# Patient Record
Sex: Male | Born: 1960 | Race: Black or African American | Hispanic: No | Marital: Single | Smoking: Current every day smoker
Health system: Southern US, Community
[De-identification: ages and names within clinical notes are randomized; demographics above are authoritative.]

## PROBLEM LIST (undated history)

## (undated) HISTORY — PX: ARTERIAL THROMBECTOMY: SHX558

---

## 2010-10-01 ENCOUNTER — Emergency Department (HOSPITAL_COMMUNITY): Payer: Self-pay

## 2010-10-01 ENCOUNTER — Encounter: Payer: Self-pay | Admitting: Emergency Medicine

## 2010-10-01 ENCOUNTER — Emergency Department (HOSPITAL_COMMUNITY)
Admission: EM | Admit: 2010-10-01 | Discharge: 2010-10-02 | Disposition: A | Discharge status: Home / Self Care | Payer: Self-pay | Attending: Emergency Medicine | Admitting: Emergency Medicine

## 2010-10-01 DIAGNOSIS — F17200 Nicotine dependence, unspecified, uncomplicated: Secondary | ICD-10-CM | POA: Insufficient documentation

## 2010-10-01 DIAGNOSIS — R51 Headache: Secondary | ICD-10-CM | POA: Insufficient documentation

## 2010-10-01 DIAGNOSIS — R110 Nausea: Secondary | ICD-10-CM | POA: Insufficient documentation

## 2010-10-01 DIAGNOSIS — Z7982 Long term (current) use of aspirin: Secondary | ICD-10-CM | POA: Insufficient documentation

## 2010-10-01 MED ORDER — SODIUM CHLORIDE 0.9 % IV SOLN
INTRAVENOUS | Status: DC
  Administered 2010-10-01: 23:00:00 via INTRAVENOUS

## 2010-10-01 MED ORDER — ONDANSETRON HCL 4 MG/2ML IJ SOLN
4.0000 mg | Freq: Once | INTRAMUSCULAR | Status: AC
  Administered 2010-10-01: 4 mg via INTRAVENOUS
  Filled 2010-10-01: qty 2

## 2010-10-01 MED ORDER — KETOROLAC TROMETHAMINE 30 MG/ML IJ SOLN
30.0000 mg | Freq: Once | INTRAMUSCULAR | Status: AC
  Administered 2010-10-02: 30 mg via INTRAVENOUS
  Filled 2010-10-01: qty 1

## 2010-10-01 MED ORDER — DIPHENHYDRAMINE HCL 50 MG/ML IJ SOLN
50.0000 mg | Freq: Once | INTRAMUSCULAR | Status: AC
  Administered 2010-10-01: 50 mg via INTRAVENOUS
  Filled 2010-10-01: qty 1

## 2010-10-01 MED ORDER — METOCLOPRAMIDE HCL 5 MG/ML IJ SOLN
10.0000 mg | Freq: Once | INTRAMUSCULAR | Status: AC
  Administered 2010-10-01: 10 mg via INTRAVENOUS
  Filled 2010-10-01: qty 2

## 2010-10-01 NOTE — ED Notes (Signed)
Patient c/o headache x 2 days; also c/o nausea; denies vomiting.

## 2010-10-01 NOTE — ED Provider Notes (Cosign Needed)
History  Scribed for Dr. Lynelle Doctor, the patient was seen in room [facility]1. The chart was scribed by Gilman Schmidt. The patients care was started at 2152.  CSN: [id number] Arrival date & time: 10/01/2010  9:25 PM  Chief Complaint  Patient presents with  . Headache    HPI   HPI Shane Price is a 50 y.o. male who presents to the Emergency Department complaining of headache. Pt reports that he was driving from [facility] yesterday and developed a headache on right side. States it got better but returned this morning about 4 am.  Pain is exacerbated by laying down and gets a throbbing to the pain, otherwise the pain is constant and nonthrobbing.  Pt has taken OTC meds with no relief. Pt has had similar symptoms 3-4 years ago but he would take OTC meds and get better, he never had to be evaluated for his headaches. . States that he can see out of right eye but it has been tearing. Associated symptoms of nausea. Denies any eye pain, blurred vision, fever, vomiting or neck pain. No recent injury. Reports recent stress factor. There are no other associated symptoms and no other alleviating or aggravating factors.   HPI ELEMENTS:  Location: Head Onset: yesterday Duration: persistent since onset  Modifying factors: exacerbated by laying down Context: as above  Associated symptoms:  Nausea but denies any eye pain, blurred vision, fever, vomiting or neck pain.   PAST MEDICAL HISTORY:  History reviewed. No pertinent past medical history.   PAST SURGICAL HISTORY:  Past Surgical History  Procedure Date  . Arterial thrombectomy      MEDICATIONS:  Previous Medications   ASPIRIN EC 81 MG TABLET    Take 81 mg by mouth daily.     IBUPROFEN (ADVIL,MOTRIN) 200 MG TABLET    Take 800 mg by mouth daily as needed. For pain      ALLERGIES:  Allergies as of 10/01/2010  . (No Known Allergies)     FAMILY HISTORY:  Denies hx of migraines, aneurysms  SOCIAL HISTORY: History  Substance Use Topics  . Smoking  status: Current Everyday Smoker -- 1.0 packs/day    Types: Cigarettes  . Smokeless tobacco: Not on file  . Alcohol Use: Yes   lives with wife   Review of Systems  Review of Systems  HENT: Negative for neck pain.   Eyes: Positive for pain. Negative for visual disturbance.  Gastrointestinal: Positive for nausea. Negative for vomiting.  Musculoskeletal:       Denies neck pain  Neurological: Positive for headaches.    Allergies  Review of patient's allergies indicates no known allergies.  Home Medications   Current Outpatient Rx  Name Route Sig Dispense Refill  . ASPIRIN EC 81 MG PO TBEC Oral Take 81 mg by mouth daily.      . IBUPROFEN 200 MG PO TABS Oral Take 800 mg by mouth daily as needed. For pain       Physical Exam    BP 132/111  Pulse 82  Temp(Src) 99 F (37.2 C) (Oral)  Resp 20  Ht 6' (1.829 m)  Wt 175 lb (79.379 kg)  BMI 23.73 kg/m2  SpO2 97%  Physical Exam  Constitutional: He is oriented to person, place, and time. He appears well-developed and well-nourished.       Looks like he feels bad.   HENT:  Head: Normocephalic and atraumatic.  Right Ear: External ear normal.  Left Ear: External ear normal.  PT has mild injection in his right sclera  Eyes: Conjunctivae and EOM are normal. Pupils are equal, round, and reactive to light.  Neck: Normal range of motion and phonation normal. Neck supple.  Cardiovascular: Normal rate, regular rhythm, normal heart sounds and intact distal pulses.   Pulmonary/Chest: Effort normal and breath sounds normal. He exhibits no bony tenderness.  Abdominal: Normal appearance. There is no tenderness.  Musculoskeletal: Normal range of motion.  Neurological: He is alert and oriented to person, place, and time. He has normal strength. No cranial nerve deficit or sensory deficit. He exhibits normal muscle tone. Coordination normal.  Skin: Skin is warm, dry and intact.  Psychiatric: He has a normal mood and affect. His behavior is  normal. Judgment and thought content normal.    ED Course  Procedures  OTHER DATA REVIEWED: Nursing notes, vital signs, and past medical records reviewed.  DIAGNOSTIC STUDIES: Oxygen Saturation is 97% on room air, normal by my interpretation.    RADIOLOGY Ct Head Wo Contrast  10/01/2010  *RADIOLOGY REPORT*  Clinical Data: Right-sided headache and nausea.  CT HEAD WITHOUT CONTRAST  Technique:  Contiguous axial images were obtained from the base of the skull through the vertex without contrast.  Comparison: None.  Findings: There is no evidence of acute infarction, mass lesion, or intra- or extra-axial hemorrhage on CT.  Mild periventricular white matter change likely reflects small vessel ischemic microangiopathy.  Small chronic lacunar infarcts are noted within the right basal ganglia.  The posterior fossa, including the cerebellum, brainstem and fourth ventricle, is within normal limits.  The third and lateral ventricles are unremarkable in appearance.  The cerebral hemispheres are symmetric in appearance, with normal gray-white differentiation.  No mass effect or midline shift is seen.  There is no evidence of fracture; visualized osseous structures are unremarkable in appearance.  The orbits are within normal limits. The paranasal sinuses and mastoid air cells are well-aerated.  No significant soft tissue abnormalities are seen.  IMPRESSION:  1.  No acute intracranial pathology seen on CT. 2.  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  Original Report Authenticated By: Tonia Ghent, M.D.    ED COURSE / COORDINATION OF CARE:  MDM:  PT given IV fluids and his headache is greatly improved.   MEDICATIONS GIVEN IN THE E.D.  Medications  0.9 %  sodium chloride infusion (  Intravenous New Bag 10/01/10 2243)  metoCLOPramide (REGLAN) 5 MG/ML injection 10 mg (10 mg Intravenous Given 10/01/10 2243)  diphenhydrAMINE (BENADRYL)  injection 50 mg (50 mg Intravenous Given 10/01/10 2243)  ondansetron (ZOFRAN) injection 4 mg (4 mg Intravenous Given 10/01/10 2243)  ketorolac (TORADOL) 30 MG/ML injection 30 mg (30 mg Intravenous Given 10/02/10 0007)    DISCHARGE MEDICATIONS: New Prescriptions   No medications on file    I personally performed the services described in this documentation, which was scribed in my presence. The recorded information has been reviewed and considered. Devoria Albe, MD, FACEP        Ward Givens, MD 10/02/10 [phone]

## 2010-10-02 ENCOUNTER — Inpatient Hospital Stay (INDEPENDENT_AMBULATORY_CARE_PROVIDER_SITE_OTHER)
Admission: RE | Admit: 2010-10-02 | Discharge: 2010-10-02 | Disposition: A | Discharge status: Home / Self Care | Payer: Self-pay | Source: Ambulatory Visit | Attending: Family Medicine | Admitting: Family Medicine

## 2010-10-02 DIAGNOSIS — R51 Headache: Secondary | ICD-10-CM

## 2010-10-02 LAB — POCT I-STAT, CHEM 8
Calcium, Ion: 1.2 mmol/L (ref 1.12–1.32)
Chloride: 106 mEq/L (ref 96–112)
Glucose, Bld: 153 mg/dL — ABNORMAL HIGH (ref 70–99)
HCT: 52 % (ref 39.0–52.0)
TCO2: 22 mmol/L (ref 0–100)

## 2010-10-02 LAB — POCT URINALYSIS DIP (DEVICE)
Protein, ur: NEGATIVE mg/dL
Specific Gravity, Urine: 1.025 (ref 1.005–1.030)
Urobilinogen, UA: 1 mg/dL (ref 0.0–1.0)

## 2010-10-02 MED ORDER — PROCHLORPERAZINE 25 MG RE SUPP: RECTAL | Status: AC

## 2011-01-05 ENCOUNTER — Encounter (HOSPITAL_COMMUNITY): Payer: Self-pay | Admitting: *Deleted

## 2011-01-05 ENCOUNTER — Emergency Department (HOSPITAL_COMMUNITY)
Admission: EM | Admit: 2011-01-05 | Discharge: 2011-01-05 | Disposition: A | Discharge status: Home / Self Care | Payer: Self-pay | Attending: Emergency Medicine | Admitting: Emergency Medicine

## 2011-01-05 ENCOUNTER — Emergency Department (HOSPITAL_COMMUNITY): Payer: Self-pay

## 2011-01-05 DIAGNOSIS — Z7982 Long term (current) use of aspirin: Secondary | ICD-10-CM | POA: Insufficient documentation

## 2011-01-05 DIAGNOSIS — IMO0002 Reserved for concepts with insufficient information to code with codable children: Secondary | ICD-10-CM | POA: Insufficient documentation

## 2011-01-05 DIAGNOSIS — S43401A Unspecified sprain of right shoulder joint, initial encounter: Secondary | ICD-10-CM

## 2011-01-05 DIAGNOSIS — F17200 Nicotine dependence, unspecified, uncomplicated: Secondary | ICD-10-CM | POA: Insufficient documentation

## 2011-01-05 DIAGNOSIS — R296 Repeated falls: Secondary | ICD-10-CM | POA: Insufficient documentation

## 2011-01-05 DIAGNOSIS — Z79899 Other long term (current) drug therapy: Secondary | ICD-10-CM | POA: Insufficient documentation

## 2011-01-05 DIAGNOSIS — M25519 Pain in unspecified shoulder: Secondary | ICD-10-CM | POA: Insufficient documentation

## 2011-01-05 MED ORDER — ETODOLAC 500 MG PO TABS: 500.0000 mg | ORAL_TABLET | Freq: Two times a day (BID) | ORAL | Status: AC

## 2011-01-05 MED ORDER — KETOROLAC TROMETHAMINE 60 MG/2ML IM SOLN
60.0000 mg | Freq: Once | INTRAMUSCULAR | Status: AC
  Administered 2011-01-05: 60 mg via INTRAMUSCULAR
  Filled 2011-01-05: qty 2

## 2011-01-05 MED ORDER — TRAMADOL HCL 50 MG PO TABS: 50.0000 mg | ORAL_TABLET | Freq: Four times a day (QID) | ORAL | Status: AC | PRN

## 2011-01-05 NOTE — ED Notes (Signed)
Pt reports he fell last week injuring his rt shoulder, tonight pt reports he reinjured his rt shoulder while sleeping, no obvious deformity noted

## 2011-01-05 NOTE — ED Notes (Signed)
Patient is alert and oriented x 4 with respirations even and unlabored.  NAD at this time.  Discharge instructions reviewed with patient and patient verbalized understanding.  Pt ambulated to lobby with steady gait, and friend to transport pt home.  

## 2011-01-05 NOTE — ED Provider Notes (Signed)
History     CSN: [id number]  Arrival date & time 01/05/11  0503   First MD Initiated Contact with Patient 01/05/11 [phone]      Chief Complaint  Patient presents with  . Shoulder Pain    (Consider location/radiation/quality/duration/timing/severity/associated sxs/prior treatment) HPI Comments: 50 year old male with a history of injuring his right shoulder 3-4 days ago. He reports that he fell backwards and tried to brace himself in the fall by catching himself with his right arm on the ground. During this fall he states he heard a small pop or snap in his right shoulder and has had subsequent constant persistent pain in that shoulder. The symptoms are moderate, worse with palpation of the lateral shoulder.  He has no numbness weakness or tingling in the hand but does have increased pain with trying to lift his arm. Symptoms were acute in onset, constant.  He denies associated injuries including head injury or neck pain. He has been using ibuprofen 3 times a day with minimal improvement  Patient is a 50 y.o. male presenting with shoulder pain. The history is provided by the patient.  Shoulder Pain    History reviewed. No pertinent past medical history.  Past Surgical History  Procedure Date  . Arterial thrombectomy     No family history on file.  History  Substance Use Topics  . Smoking status: Current Everyday Smoker -- 1.0 packs/day    Types: Cigarettes  . Smokeless tobacco: Not on file  . Alcohol Use: Yes      Review of Systems  Musculoskeletal: Positive for arthralgias. Negative for back pain and joint swelling.  Skin: Negative for rash.  Neurological: Negative for weakness.  Hematological: Does not bruise/bleed easily.    Allergies  Review of patient's allergies indicates no known allergies.  Home Medications   Current Outpatient Rx  Name Route Sig Dispense Refill  . ASPIRIN EC 81 MG PO TBEC Oral Take 81 mg by mouth daily.      . ETODOLAC 500 MG PO TABS Oral  Take 1 tablet (500 mg total) by mouth 2 (two) times daily. 30 tablet 0  . IBUPROFEN 200 MG PO TABS Oral Take 800 mg by mouth daily as needed. For pain     . PROCHLORPERAZINE 25 MG RE SUPP  Use 1 pr prn headache or nausea and vomiting. 3 suppository 0  . TRAMADOL HCL 50 MG PO TABS Oral Take 1 tablet (50 mg total) by mouth every 6 (six) hours as needed for pain. Maximum dose= 8 tablets per day 15 tablet 0    BP 125/79  Pulse 78  Resp 18  SpO2 94%  Physical Exam  Nursing note and vitals reviewed. Constitutional: He appears well-developed and well-nourished. No distress.  HENT:  Head: Normocephalic and atraumatic.  Eyes: Conjunctivae are normal. No scleral icterus.  Cardiovascular: Normal rate, regular rhythm and intact distal pulses.   Pulmonary/Chest: Effort normal and breath sounds normal.  Musculoskeletal: He exhibits tenderness ( Mild tenderness over the lateral shoulder, no tenderness or deformity to the clavicle.). He exhibits no edema.       Decreased range of motion at the right shoulder. Pain with external rotation against resistance, pain with abduction to resistance. No pain with internal rotation  Neurological: He is alert.       Normal motor and sensation distal to the right shoulder.  Skin: Skin is warm and dry. No rash noted. He is not diaphoretic.    ED Course  Procedures (including critical  care time)  Labs Reviewed - No data to display Dg Shoulder Right  01/05/2011  *RADIOLOGY REPORT*  Clinical Data: Shoulder pain and limited range of motion after fall 4 days ago.  RIGHT SHOULDER - 2+ VIEW  Comparison: None.  Findings: No evidence of acute fracture or subluxation.  The glenohumeral and acromioclavicular joints appear intact.  No focal bone lesion or bone destruction.  Visualized right ribs appear intact.  Bone cortex and trabecular architecture appear intact.  IMPRESSION: No acute fracture or subluxation suggested.  Original Report Authenticated By: Marlon Pel,  M.D.     1. Sprain of right shoulder       MDM  Suspect rotator cuff injury, x-rays to rule out a small fracture, does not appear to be dislocated. Intramuscular Toradol ordered for pain control  Pain medication given, x-ray of the shoulder showing no fracture or dislocation. Rice therapy initiated with ice, immobilization, Toradol      Vida Roller, MD 01/05/11 [phone]

## 2013-09-08 IMAGING — CR DG SHOULDER 2+V*R*
4 series · 4 of 4 positions shown · non-contrast
Comparison: None.

CLINICAL DATA: Shoulder pain and limited range of motion after fall
4 days ago.

RIGHT SHOULDER - 2+ VIEW

[view not recorded (1 of 4)]
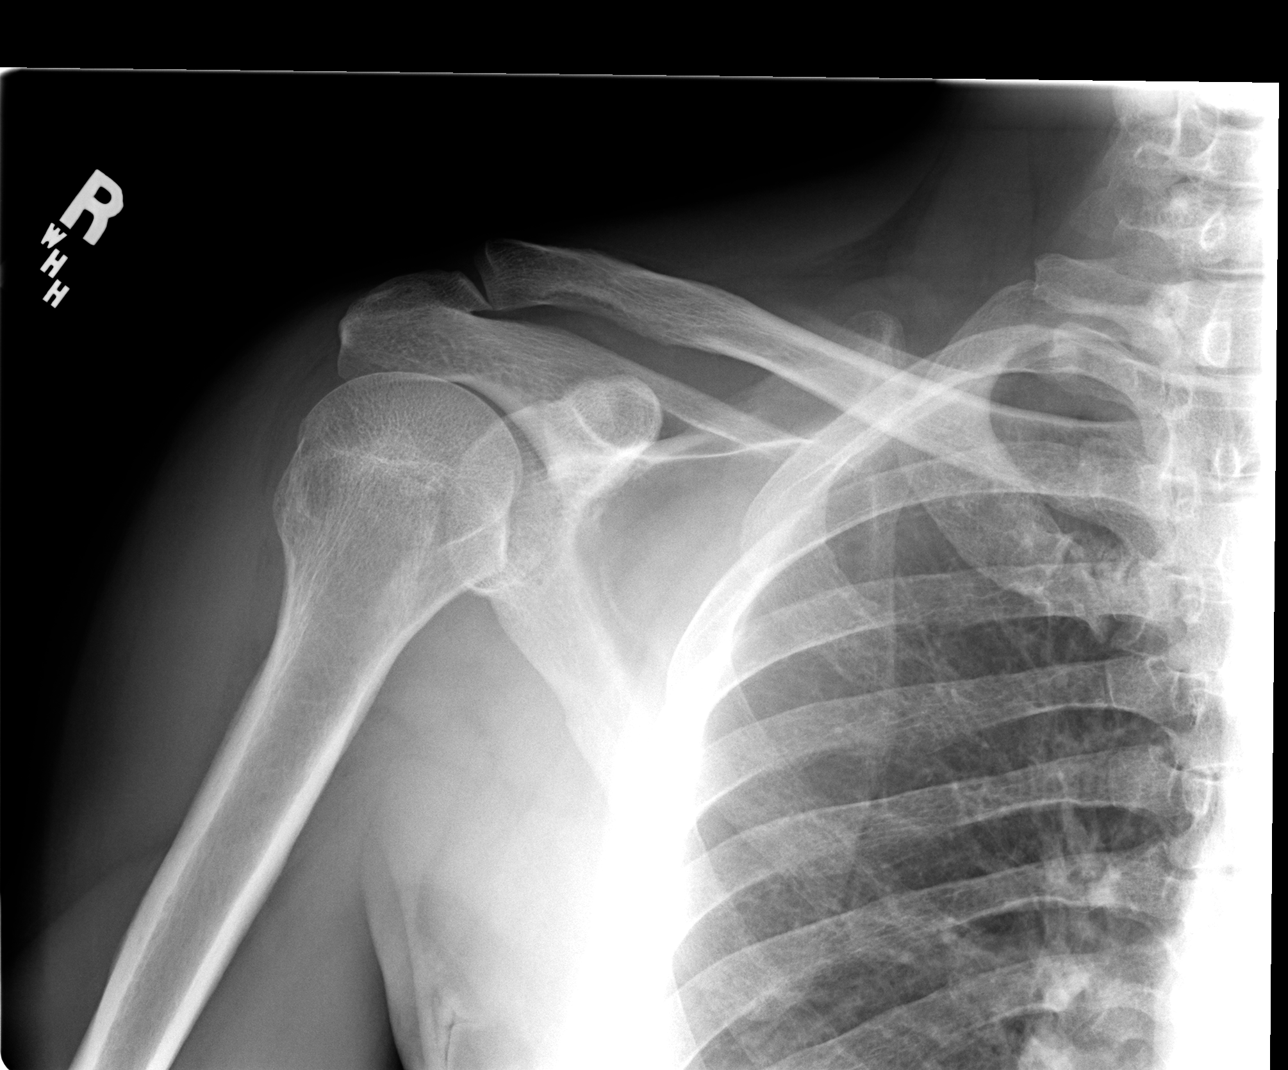

[view not recorded (2 of 4)]
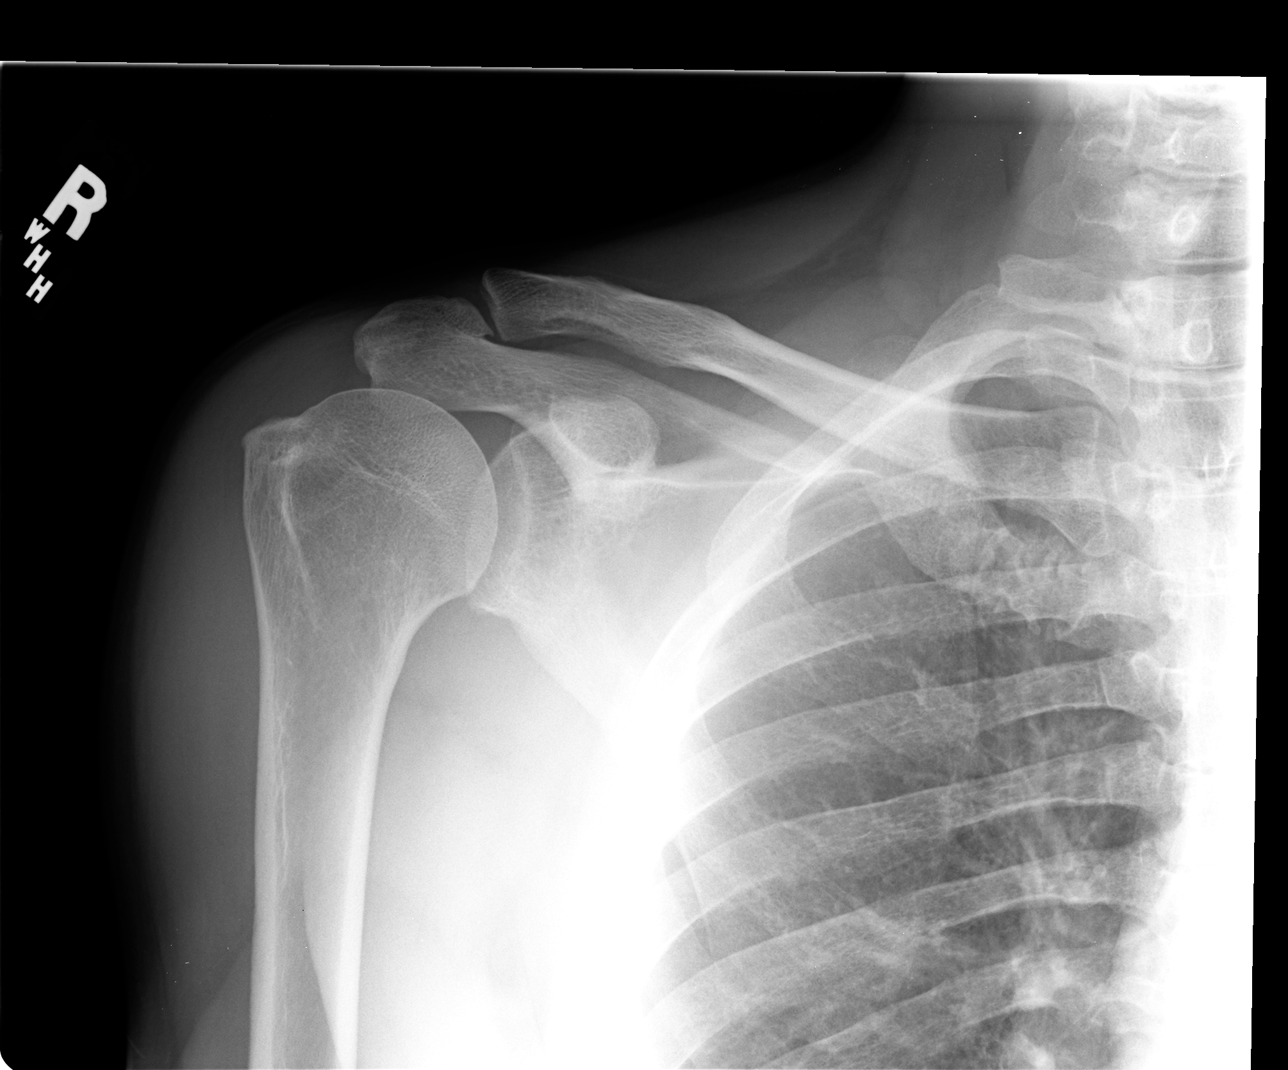

[view not recorded (3 of 4)]
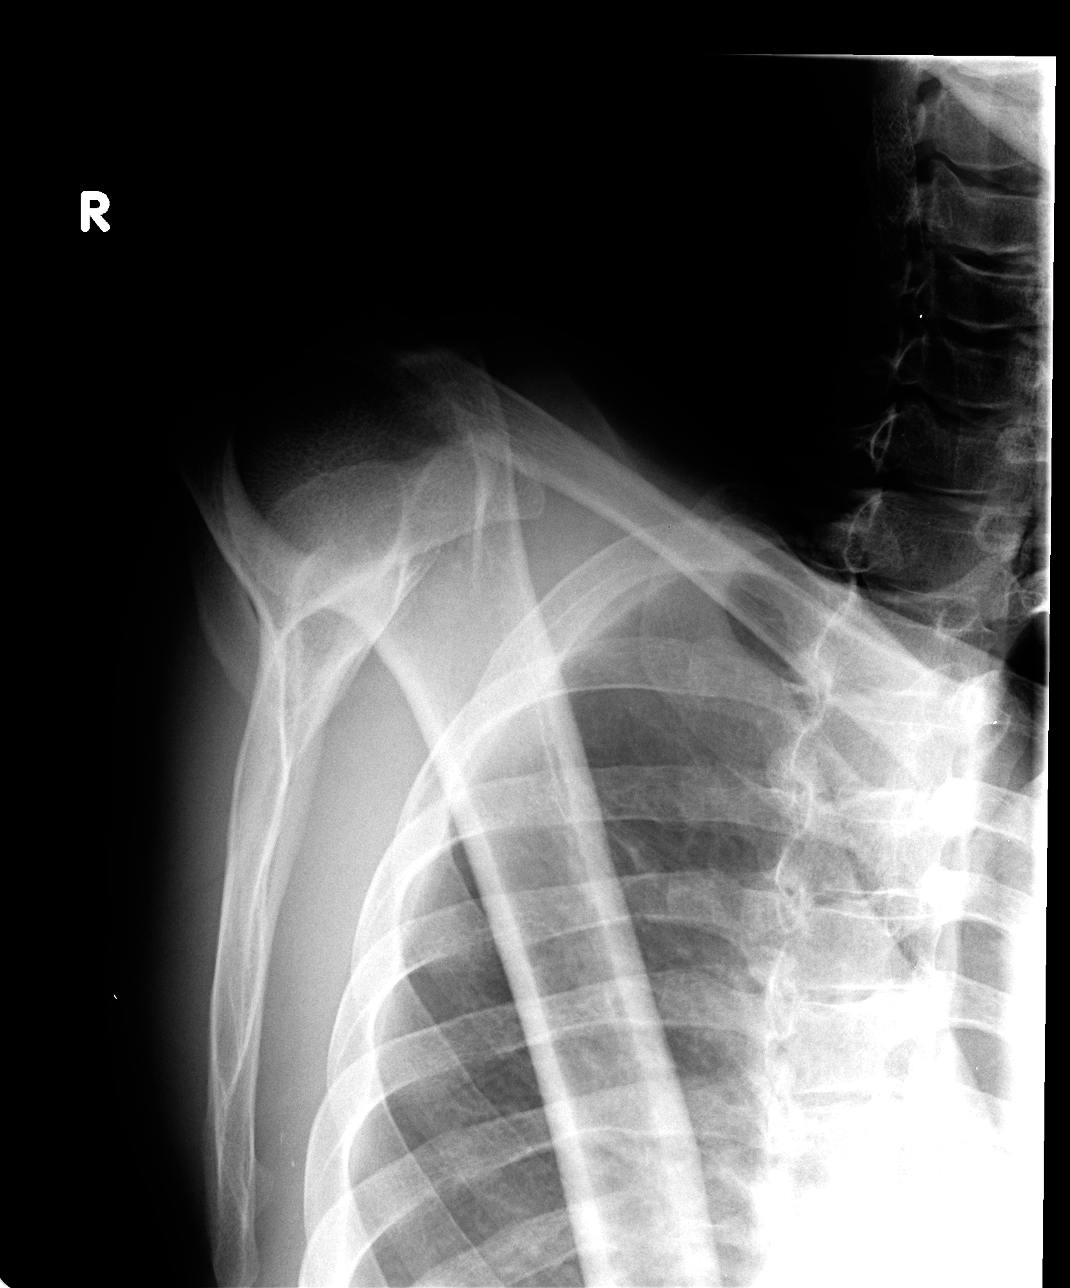

[view not recorded (4 of 4)]
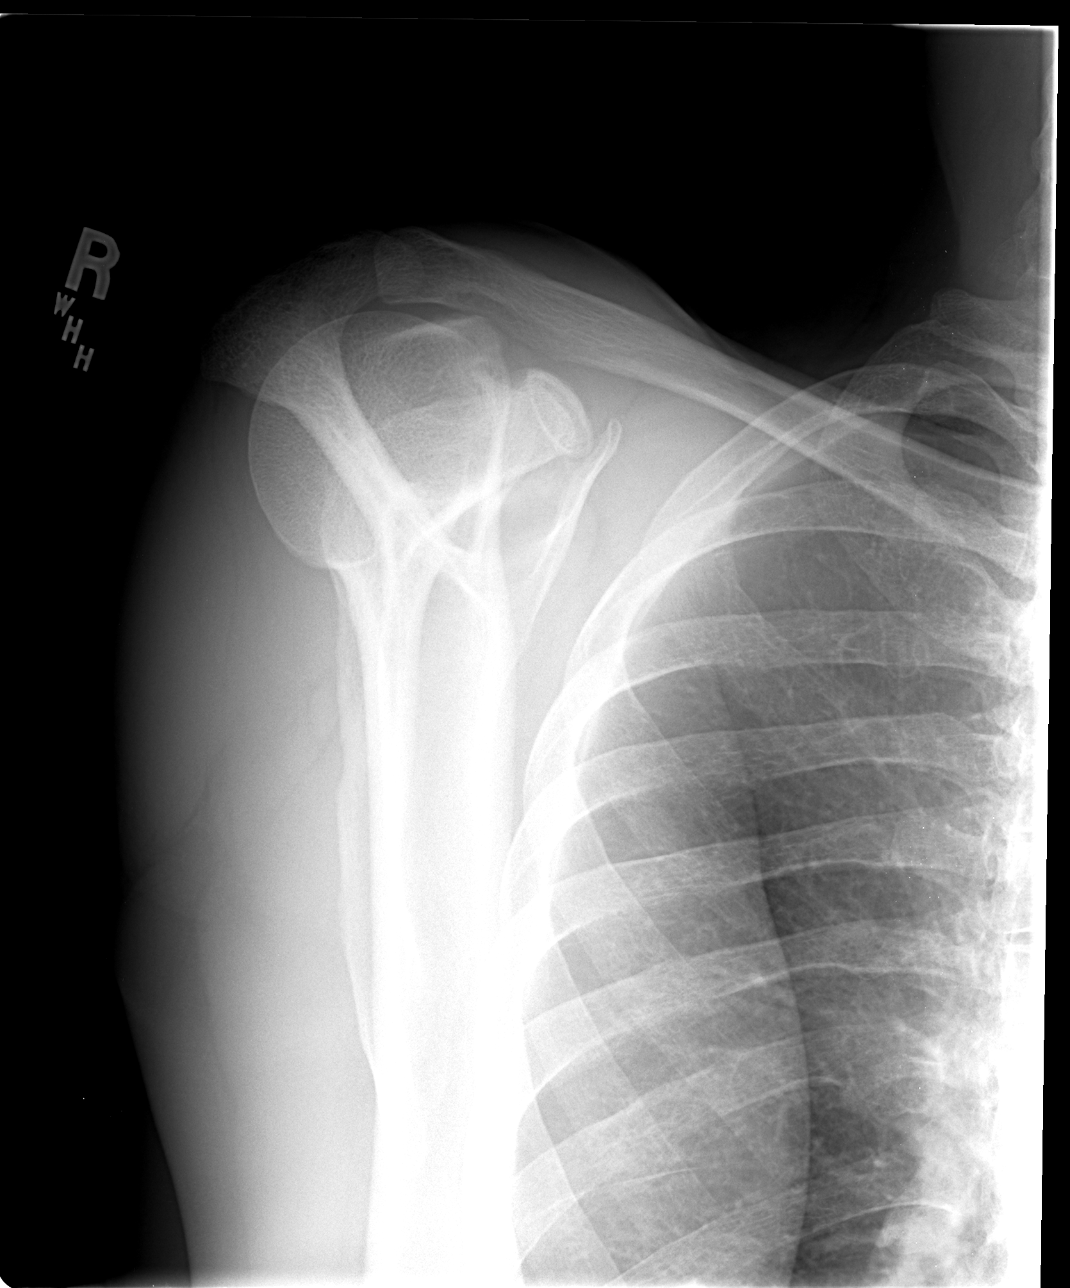

[4 of 4 positions shown; findings below may reference images not displayed]

FINDINGS: No evidence of acute fracture or subluxation.  The
glenohumeral and acromioclavicular joints appear intact.  No focal
bone lesion or bone destruction.  Visualized right ribs appear
intact.  Bone cortex and trabecular architecture appear intact.
IMPRESSION: No acute fracture or subluxation suggested.

## 2016-03-04 ENCOUNTER — Encounter (HOSPITAL_COMMUNITY): Payer: Self-pay | Admitting: Speech Pathology

## 2016-03-04 ENCOUNTER — Ambulatory Visit (HOSPITAL_COMMUNITY): Payer: Self-pay | Attending: Neurology | Admitting: Physical Therapy

## 2016-03-04 ENCOUNTER — Ambulatory Visit (HOSPITAL_COMMUNITY): Payer: Self-pay | Admitting: Speech Pathology

## 2016-03-04 DIAGNOSIS — R2681 Unsteadiness on feet: Secondary | ICD-10-CM | POA: Insufficient documentation

## 2016-03-04 DIAGNOSIS — R41841 Cognitive communication deficit: Secondary | ICD-10-CM | POA: Insufficient documentation

## 2016-03-04 DIAGNOSIS — R2689 Other abnormalities of gait and mobility: Secondary | ICD-10-CM | POA: Insufficient documentation

## 2016-03-04 DIAGNOSIS — M6281 Muscle weakness (generalized): Secondary | ICD-10-CM | POA: Insufficient documentation

## 2016-03-04 NOTE — Patient Instructions (Signed)
   BRIDGING  While lying on your back, tighten your lower abdominals, squeeze your buttocks and then raise your buttocks off the floor/bed as creating a "Bridge" with your body.   Repeat 10-15 times each side, twice a day.    HIP ABDUCTION - SIDELYING  While lying on your side, slowly raise up your top leg to the side. Keep your knee straight and maintain your toes pointed forward the entire time. Keep your leg in-line with your body.  The bottom leg can be bent to stabilize your body.  Repeat 10 times each side, at least twice a day.      TANDEM STANCE WITH SUPPORT  Stand in front of a chair, table or counter top for support. Then place the heel of one foot so that it is touching the toes of the other foot.    Try to hold this at least 15 seconds, then switch sides.  Repeat twice each side, 2 times a day.     SINGLE LEG STANCE - SLS  Stand on one leg and maintain your balance.  Hold for as long as you can, then switch your feet. You may use your fingertips for balance but do not depend on them- this should still feel challenging.  Repeat 2-3 times each side, twice a day.      CONTINUE WITH REGULAR WALKING THROUGHOUT YOUR DAY!!!

## 2016-03-04 NOTE — Therapy (Signed)
[facility] [address] [place], [place], [address] Phone: [phone]   Fax:  [phone]  Physical Therapy Evaluation  Patient Details  Name: Shane Price MRN: [id number] Date of Birth: 1960-03-17 Referring Provider: Jeneen Rinks Paschall   Encounter Date: 03/04/2016      PT End of Session - 03/04/16 1531    Visit Number 1   Number of Visits 4  estimated based on what patient might possibly get from Medicaid- may update as appropriate    Date for PT Re-Evaluation 04/01/16   Authorization Type None, working on getting Medicaid. Self-pay for eval/trying to get Medicaid coverage for treatment sessions    Authorization Time Period 03/04/16 to 04/01/16   PT Start Time 1437   PT Stop Time 1515   PT Time Calculation (min) 38 min   Equipment Utilized During Treatment Gait belt   Activity Tolerance Patient tolerated treatment well   Behavior During Therapy [facility] for tasks assessed/performed      No past medical history on file.  Past Surgical History:  Procedure Laterality Date  . ARTERIAL THROMBECTOMY      There were no vitals filed for this visit.       Subjective Assessment - 03/04/16 1441    Subjective Patient reports that the night before Valentines, he felt funny when talking on the phone talking to his daughter; he had difficulty speaking, and was taken to the hospital the next day. He has had some issues with his speech and balance since the stroke. Family reports that his R UE/hand is a little weaker. No falls recently, some close calls with falling however. This is the first formal therapy he's getting    Pertinent History history of arterial thrombectomy, CVA  on 02/21/16   Patient Stated Goals wants to get full R UE/LE use back, wants to work on speech    Currently in Pain? No/denies            [facility] PT Assessment - 03/04/16 0001      Assessment   Medical Diagnosis CVA    Referring Provider Jeneen Rinks Paschall    Onset Date/Surgical Date 02/21/16   Next MD Visit Dr. Albertha Ghee next week    Prior Therapy none      Precautions   Precaution Comments none known      Balance Screen   Has the patient fallen in the past 6 months No   Has the patient had a decrease in activity level because of a fear of falling?  No   Is the patient reluctant to leave their home because of a fear of falling?  No     Prior Function   Level of Independence Independent;Independent with basic ADLs;Independent with gait;Independent with transfers   Vocation Unemployed     Strength   Right Hip Extension 2+/5   Right Hip ABduction 4/5   Left Hip Extension 3/5   Left Hip ABduction 5/5   Right Ankle Dorsiflexion 4/5   Left Ankle Dorsiflexion 5/5     Transfers   Five time sit to stand comments  14.5 no UEs      Ambulation/Gait   Gait Comments proximal weakness, reduced ankle DF,r educed gait speed, drift to R      6 minute walk test results    Aerobic Endurance Distance Walked 226   Endurance additional comments 1 minute, 12 seconds; gait speed 1.23m/s       Standardized Balance Assessment   Standardized Balance  Assessment Dynamic Gait Index     Dynamic Gait Index   Level Surface Mild Impairment   Change in Gait Speed Mild Impairment   Gait with Horizontal Head Turns Mild Impairment   Gait with Vertical Head Turns Mild Impairment   Gait and Pivot Turn Normal   Step Over Obstacle Mild Impairment   Step Around Obstacles Mild Impairment   Steps Mild Impairment   Total Score 17                           PT Education - 03/04/16 1530    Education provided Yes   Education Details prognosis, POC, HEP; educated patient that since they are self-pay right now, they will need to sign self-pay waiver at front desk; work on HEP for now- we will set up 1 appt in 2 weeks, but please call if Medicaid has not come through at this point as we at least need Medicaid number to submit for visits    Person(s)  Educated Patient   Methods Explanation;Handout   Comprehension Verbalized understanding;Returned demonstration;Verbal cues required;Need further instruction          PT Short Term Goals - 03/04/16 1537      PT SHORT TERM GOAL #1   Title Patient to be able to verbally state 5/5 safety precautions to prevent falls/improve safety in order to improve general mobility at home and in community    Time 2   Period Weeks   Status New     PT SHORT TERM GOAL #2   Title Patient to be able to complete 5 time sit to stand in 10 seconds or less to show improved functional strength and mobility    Time 2   Period Weeks   Status New     PT SHORT TERM GOAL #3   Title Patient to be able to verbally state the importance of regular physical activity in improving general condition and reducing future risk of CVA in order to improve self-efficacy in managing general health status    Time 2   Period Weeks   Status New     PT SHORT TERM GOAL #4   Title Patient to be independent in correctly and consistently performing targeted HEP, to be updated as appropriate    Time 1   Period Weeks   Status New           PT Long Term Goals - 03/04/16 1539      PT LONG TERM GOAL #1   Title Patient to demonstrate functional strength as being 5/5 in order to improve balance and reduce fall risk    Time 4   Period Weeks   Status New     PT LONG TERM GOAL #2   Title Patient to score 21 on DGI in order to show improved mobility in dynamic scenarios and reduce overall fall risk    Time 4   Period Weeks   Status New     PT LONG TERM GOAL #3   Title Patient to be able to ambulate at least 1.5765m/s in order to show improved mobility and associated reduced fall risk in community    Time 4   Period Weeks   Status New     PT LONG TERM GOAL #4   Title Patient to be participatory in appropriate regular exercise program, at least 20 minutes in duration and 4 days per week, in order to improve general health  status and maintain functional gains    Time 4   Period Weeks   Status New               Plan - 03/04/16 1533    Clinical Impression Statement Patient arrives almost 2 weeks post-CVA, which occurred on Valentines Day 2018; he reports that he has been doing fairly well but his main concerns right now are his balance, his speech, and getting his hand working well again. This is the first formal therapy he is having; he is currently self-pay and willing to participate with evaluation/educated on self-pay waiver, however his daughter reports that Medicaid will hopefully be finalized within the next couple of weeks. Examination reveals functional weakness, unsteadiness with dynamic balance testing, and reduced gait speed as indicated in formal measures section. Patient appears to be at relatively high level post CVA, and was given strength and balance HEP to work on until [employer] clears- asked them to please call us if Medicaid does not come through by time of next appointment in 2 weeks. Patient will benefit from skilled PT services to optimize level of function and reduce fall risk as insurance allows moving forward.    Rehab Potential Good   Clinical Impairments Affecting Rehab Potential (+) relatively young age, low level of functional impairment post-CVA, appears motivated to participate in skilled PT services; (-) financial limitations   PT Frequency Other (comment)  Eval; TBD pending on how many visits he can get via Medicaid when this is finalized    PT Duration Other (comment)  TBD depending on how many visits he can get once Medicaid is finalized    PT Treatment/Interventions ADLs/Self Care Home Management;Gait training;Stair training;Functional mobility training;Therapeutic activities;Therapeutic exercise;Balance training;Neuromuscular re-education;Patient/family education;Energy conservation;Taping   PT Next Visit Plan review initial eval and goals; focus on strengthening and dynamic  balance. Update HEP as appropriate and safe to do so.    PT Home Exercise Plan Eval: bridges, sidelying hip ABD, tandem stance, SLS, walking program    Recommended Other Services possible OT if Medicaid will allow once patient has this set up    Consulted and Agree with Plan of Care Patient      Patient will benefit from skilled therapeutic intervention in order to improve the following deficits and impairments:  Abnormal gait, Decreased coordination, Decreased mobility, Decreased activity tolerance, Decreased strength, Decreased balance, Difficulty walking  Visit Diagnosis: Unsteadiness on feet  Muscle weakness (generalized)  Other abnormalities of gait and mobility     Problem List There are no active problems to display for this patient.   Nedra Hai PT, DPT [phone]  [facility] [address] [place], [place], [address] Phone: [phone]   Fax:  [phone]  Name: Shane Price MRN: [id number] Date of Birth: Apr 03, 1960

## 2016-03-04 NOTE — Therapy (Signed)
[facility] [address] [place], [place], [address] Phone: [phone]   Fax:  [phone]  Speech Language Pathology Evaluation  Patient Details  Name: Shane Price MRN: [id number] Date of Birth: March 10, 1960 Referring Provider: Gerrit Friends Paschall  Encounter Date: 03/04/2016      End of Session - 03/04/16 1704    Visit Number 1   SLP Start Time 1521   SLP Stop Time  1625   SLP Time Calculation (min) 64 min   Activity Tolerance Patient tolerated treatment well      History reviewed. No pertinent past medical history.  Past Surgical History:  Procedure Laterality Date  . ARTERIAL THROMBECTOMY      There were no vitals filed for this visit.      Subjective Assessment - 03/04/16 1700    Subjective "I can't find my words."   Patient is accompained by: Family member   Special Tests Montreal Cognitive Assessment (MoCA)   Currently in Pain? No/denies            SLP Evaluation [facility] - 03/04/16 1312      SLP Visit Information   SLP Received On 03/04/16   Referring Provider Gerrit Friends Paschall   Onset Date 02/21/2016   Medical Diagnosis Left MCA CVA     Subjective   Subjective "I have trouble finding words and getting them out"   Patient/Family Stated Goal To go back to work     Pain Assessment   Currently in Pain? No/denies     General Information   HPI Shane Price is a 56 year old male who presented to the ED at [facility] in [place], on 02/21/2016 with stroke like symptoms. Pt received tPA. MRI showed acute stroke left middle cerebral artery distribution. 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 Pt was discharged home with his daughter to  [facility]. Referred for OP speech language evaluation by Dr. Kristopher Glee. Accompanied to today's evaluation by daughter and son. Pt and family report difficulty with word finding and thought organization during conversational speech.   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 Shows signs of difficulty with working memory.     Prior Functional Status   Cognitive/Linguistic Baseline Within functional limits    Lives With Significant other   Available Support Family;Friend(s)   Education 9th grade     Cognition   Overall Cognitive Status Impaired/Different from baseline   Area of Impairment Attention;Memory   Current Attention Level Sustained   Memory Decreased short-term memory   Attention Sustained   Sustained Attention Impaired   Sustained Attention Impairment Verbal basic   Memory Impaired   Memory Impairment Retrieval deficit;Decreased recall of new information   Awareness Appears intact     Auditory Comprehension   Overall Auditory Comprehension Appears within functional limits for tasks assessed   Yes/No Questions Within Functional Limits   Commands Within Functional Limits   Conversation Moderately complex   Interfering Components Working memory   Nurse, mental health   Discrimination Within Function Limits     Expression   Primary Mode of Expression Verbal     Verbal Expression   Overall Verbal Expression Appears within functional limits for tasks assessed  Repetition No impairment   Naming Impairment   Responsive Not tested   Confrontation 75-100% accurate   Convergent Not tested   Divergent Not tested   Pragmatics No impairment     Written Expression   Dominant Hand Right   Written Expression Not tested     Oral Motor/Sensory Function   Overall Oral Motor/Sensory Function Impaired   Labial ROM Reduced right   Labial Symmetry Abnormal symmetry right   Labial Strength Reduced  Right   Labial Sensation Within Functional Limits   Labial Coordination Reduced   Lingual ROM Reduced right   Lingual Symmetry Abnormal symmetry right   Lingual Strength Reduced Right   Lingual Sensation Within Functional Limits   Lingual Coordination WFL   Facial ROM Reduced right   Facial Symmetry Right droop   Facial Strength Reduced   Facial Sensation Within Functional Limits   Facial Coordination Reduced   Velum Within Functional Limits   Mandible Within Functional Limits     Motor Speech   Overall Motor Speech Appears within functional limits for tasks assessed   Respiration Within functional limits   Phonation Normal   Resonance Within functional limits     Standardized Assessments   Standardized Assessments  Montreal Cognitive Assessment (MOCA)   Montreal Cognitive Assessment (MOCA)  Pt scored 21/30 with "normalized score" being 26/30.            SLP Education - 03/04/16 1702    Education provided Yes   Education Details Educated on compensatory strategies for working memory, swallowing strategies, and protecting against aspiration risk   Person(s) Educated Patient;Child(ren)   Methods Explanation;Verbal cues;Handout   Comprehension Verbalized understanding          Plan - 03/04/16 1341    Clinical Impression Statement Pt presenting with mild cognitive linguistic impairment characterized by mild dysarthria, difficulty with word finding, decreased working and short-term memory, and naming fluency. Wet vocal quality identified upon start of evaluation, so informal clinical assessment of swallow with thins and crackers completed. Although no overt coughing elicited during trials, wet vocal quality and reports of occasional coughing at home with liquids is concerning. Pt and family were presented with written handout on signs/symptoms of aspiration and encouraged to notify MD if shortness of breath, fever, or chest congestion noted. Pt may benefit from instrumental  assessment (MBSS) to objectively evaluate swallow to determine risk for aspiration, areas of impairment, and compensatory strategies as indicated. Pt does not have insurance at this time, so this assessment may be cost prohibitive to Pt. Pt with right sided facial weakness and decreased ROM. Pt reports difficulty finding words and communicating thoughts and this was exhibited during today's evaluation. Currently, the Pt is out of work and living with his daughter in the area. Deficits are consistent with stroke symptoms and Pt would benefit from skilled SLP services in order to address the above impairments, maximize independence, and decrease burden of care. Unfortunately, due to lack of insurance, Pt is unable to come for treatment at this time, but he has filed for [employer] and is awaiting approval. Specific goals to be determined based on evolving needs pending return to clinic. Pt/family given written strategies to assist with word finding, memory, swallowing, and dysarthria to aid until formal SLP services approved per insurance.   Treatment/Interventions Language facilitation;Compensatory techniques;SLP instruction and feedback;Patient/family education;Compensatory strategies   Potential to Achieve Goals Good   Potential Considerations Financial resources;Previous level of function;Family/community support   Consulted and Agree with Plan of  Care Patient;Family member/caregiver   Family Member Consulted Daughter and son         Patient will benefit from skilled therapeutic intervention in order to improve the following deficits and impairments:   Cognitive communication deficit    Problem List There are no active problems to display for this patient.  Olene CravenBess Rea Kalama SLP Graduate Clinician   Olene CravenBess Avian [place]awalt 03/04/2016, 5:06 PM  [facility] [address] [place], [place], [address] Phone: [phone]   Fax:  [phone]  Name: Shane Price MRN:  [id number] Date of Birth: 08/04/1960

## 2016-03-07 NOTE — Addendum Note (Signed)
Addended by: Milinda PointerUNGER, KRISTEN E on: 03/07/2016 03:15 PM   Modules accepted: Orders

## 2016-03-07 NOTE — Addendum Note (Signed)
Addended by: Dorene ArPORTER, Glendon Fiser V. on: 03/07/2016 11:12 AM   Modules accepted: Orders
